# Patient Record
Sex: Male | Born: 1945 | Race: White | Hispanic: No | Marital: Married | Smoking: Never smoker
Health system: Southern US, Community
[De-identification: ages and names within clinical notes are randomized; demographics above are authoritative.]

## PROBLEM LIST (undated history)

## (undated) DIAGNOSIS — I10 Essential (primary) hypertension: Secondary | ICD-10-CM

## (undated) HISTORY — PX: BRAIN SURGERY: SHX531

---

## 2003-02-27 ENCOUNTER — Emergency Department (HOSPITAL_COMMUNITY): Admission: AD | Admit: 2003-02-27 | Discharge: 2003-02-27 | Payer: Self-pay | Admitting: Family Medicine

## 2003-09-05 ENCOUNTER — Encounter: Admission: RE | Admit: 2003-09-05 | Discharge: 2003-09-05 | Payer: Self-pay | Admitting: Internal Medicine

## 2005-03-15 IMAGING — CR DG CHEST 2V
3 series · 3 of 3 positions shown · non-contrast
Comparison: none

CLINICAL DATA: Short of breath, wheezing.
 CHEST X-RAY
 Two views of the chest show the lungs to be slightly hyperaerated.  The heart is within normal limits in size.  No bony abnormality is seen.
 IMPRESSION
 No active lung disease.  Slight hyperaeration.

[view not recorded (1 of 3)]
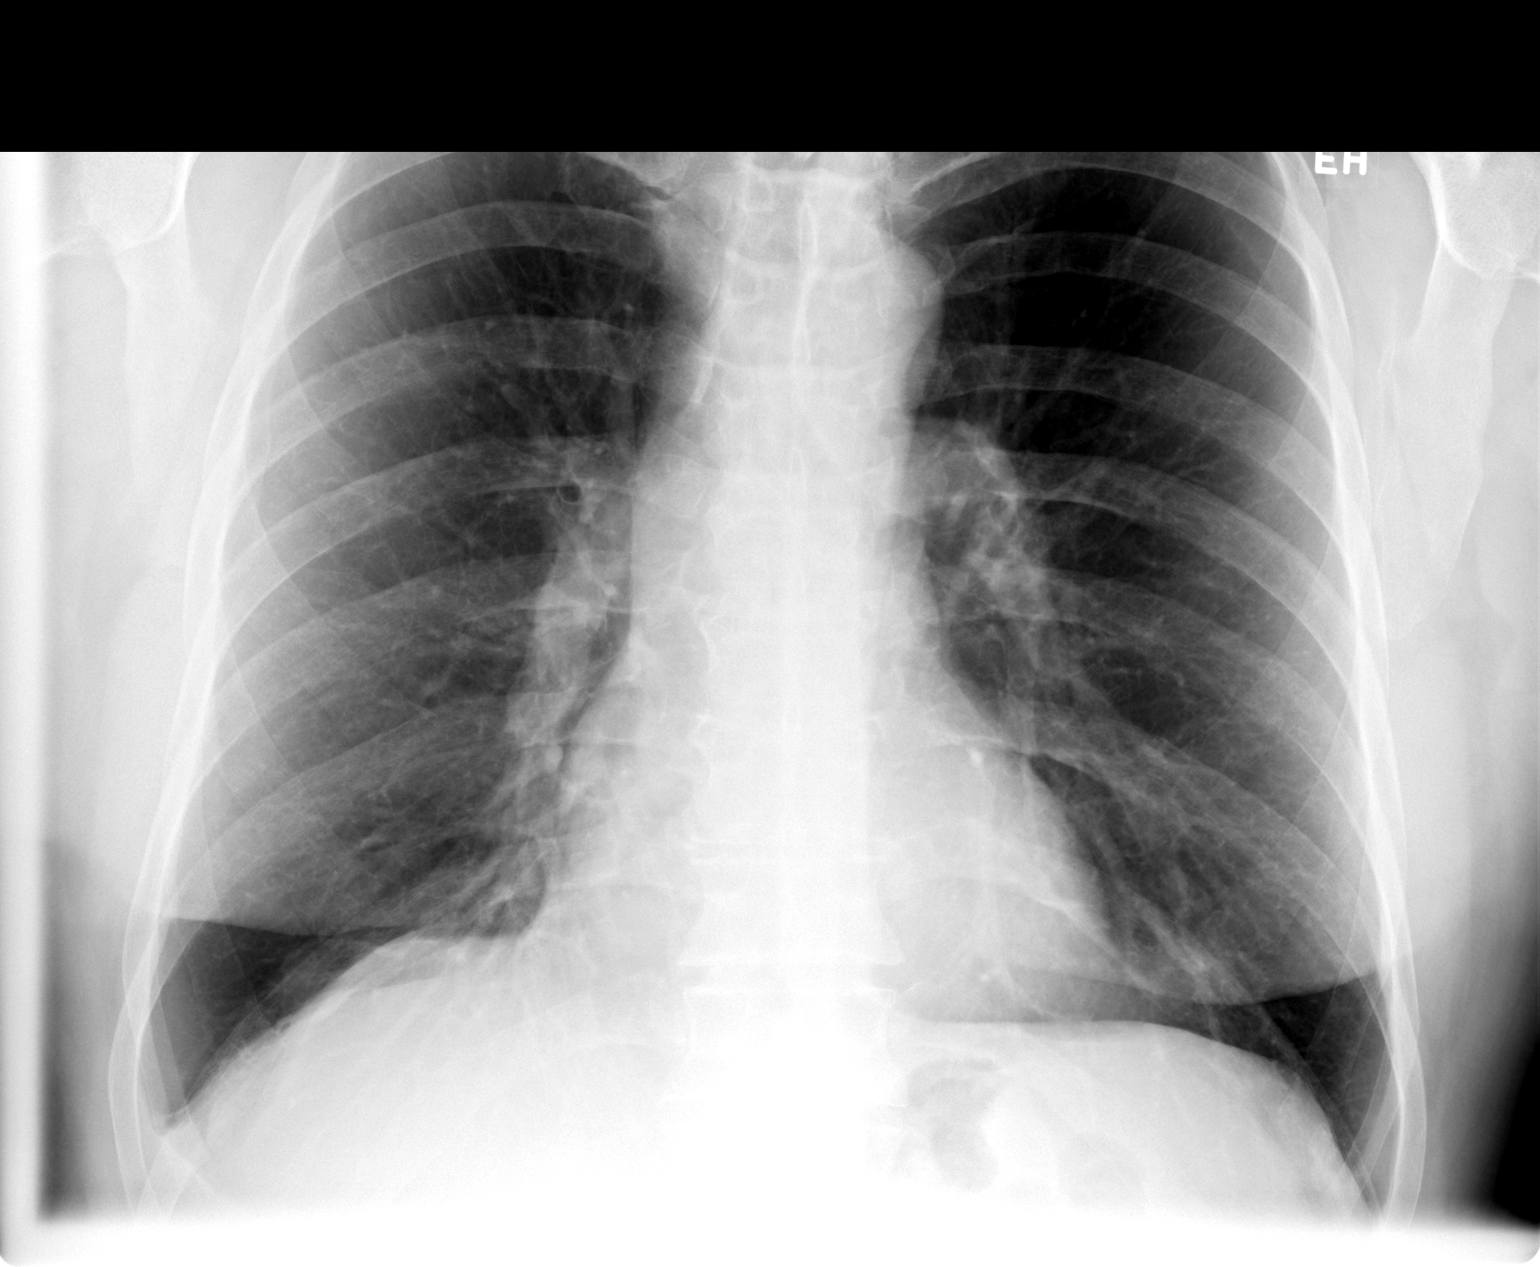

[view not recorded (2 of 3)]
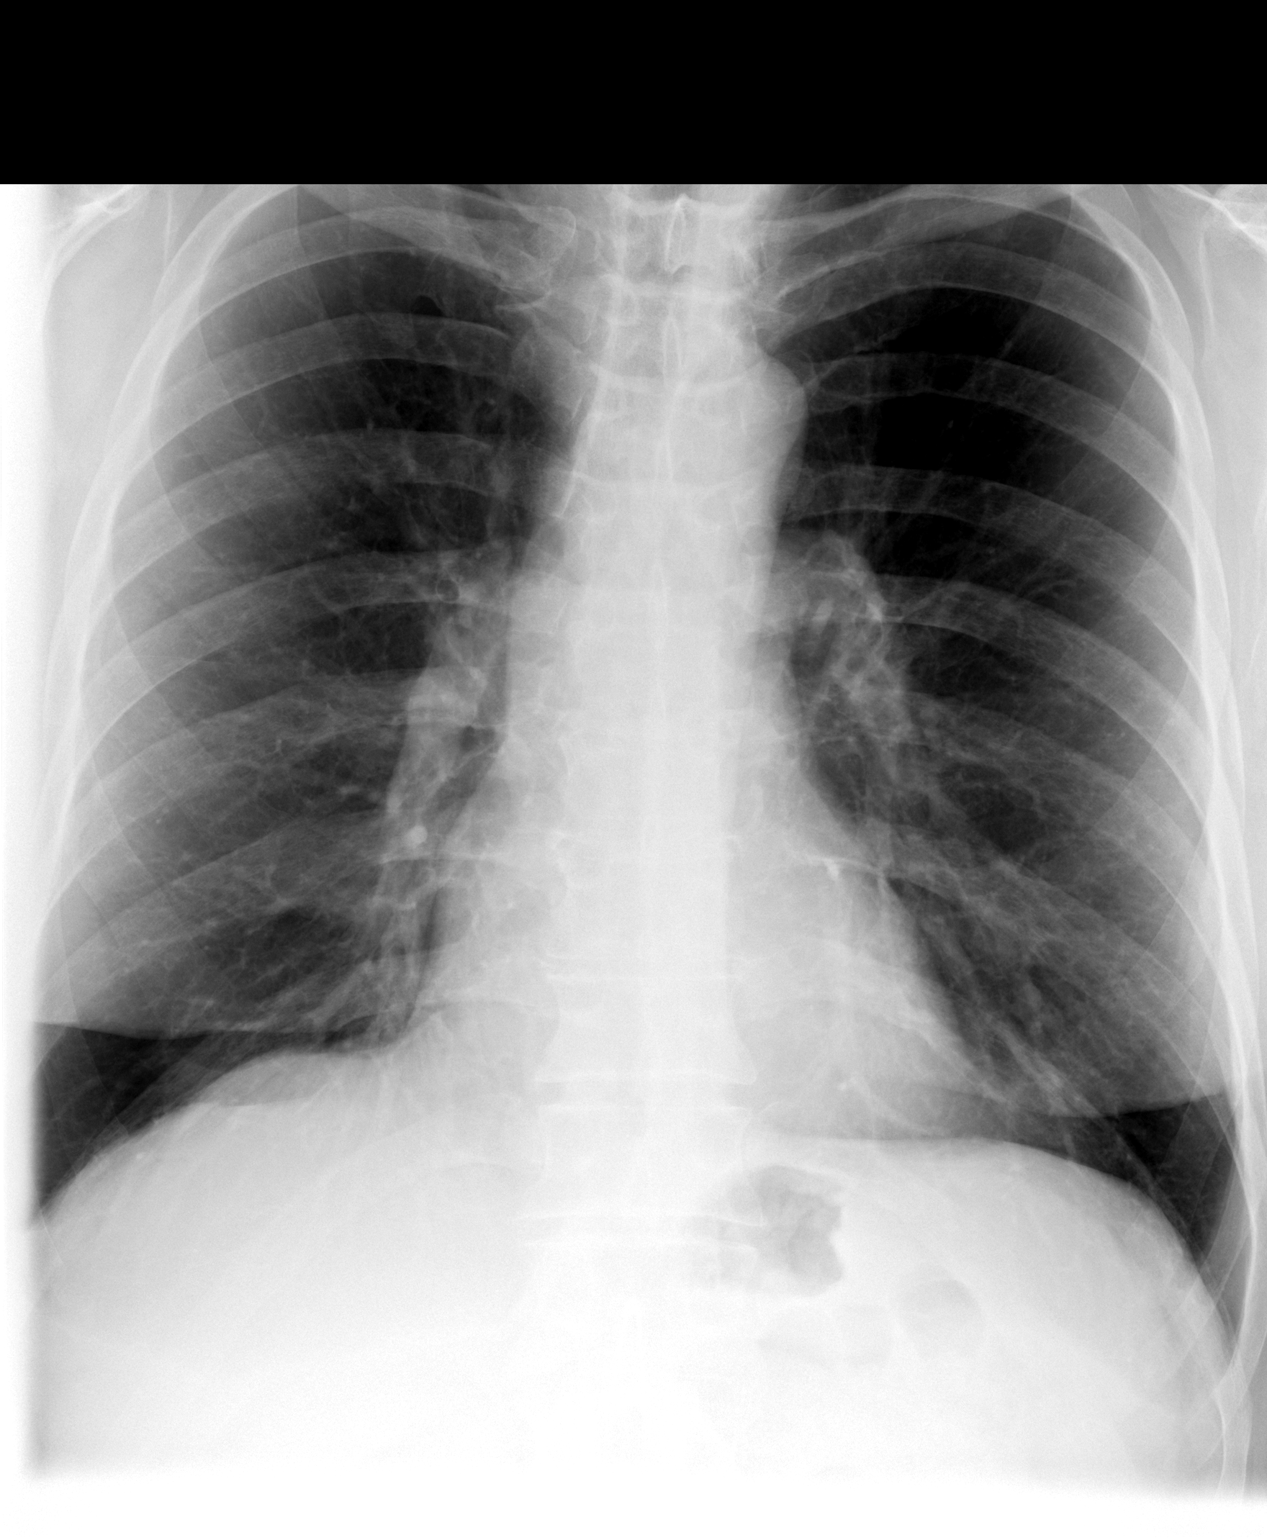

[view not recorded (3 of 3)]
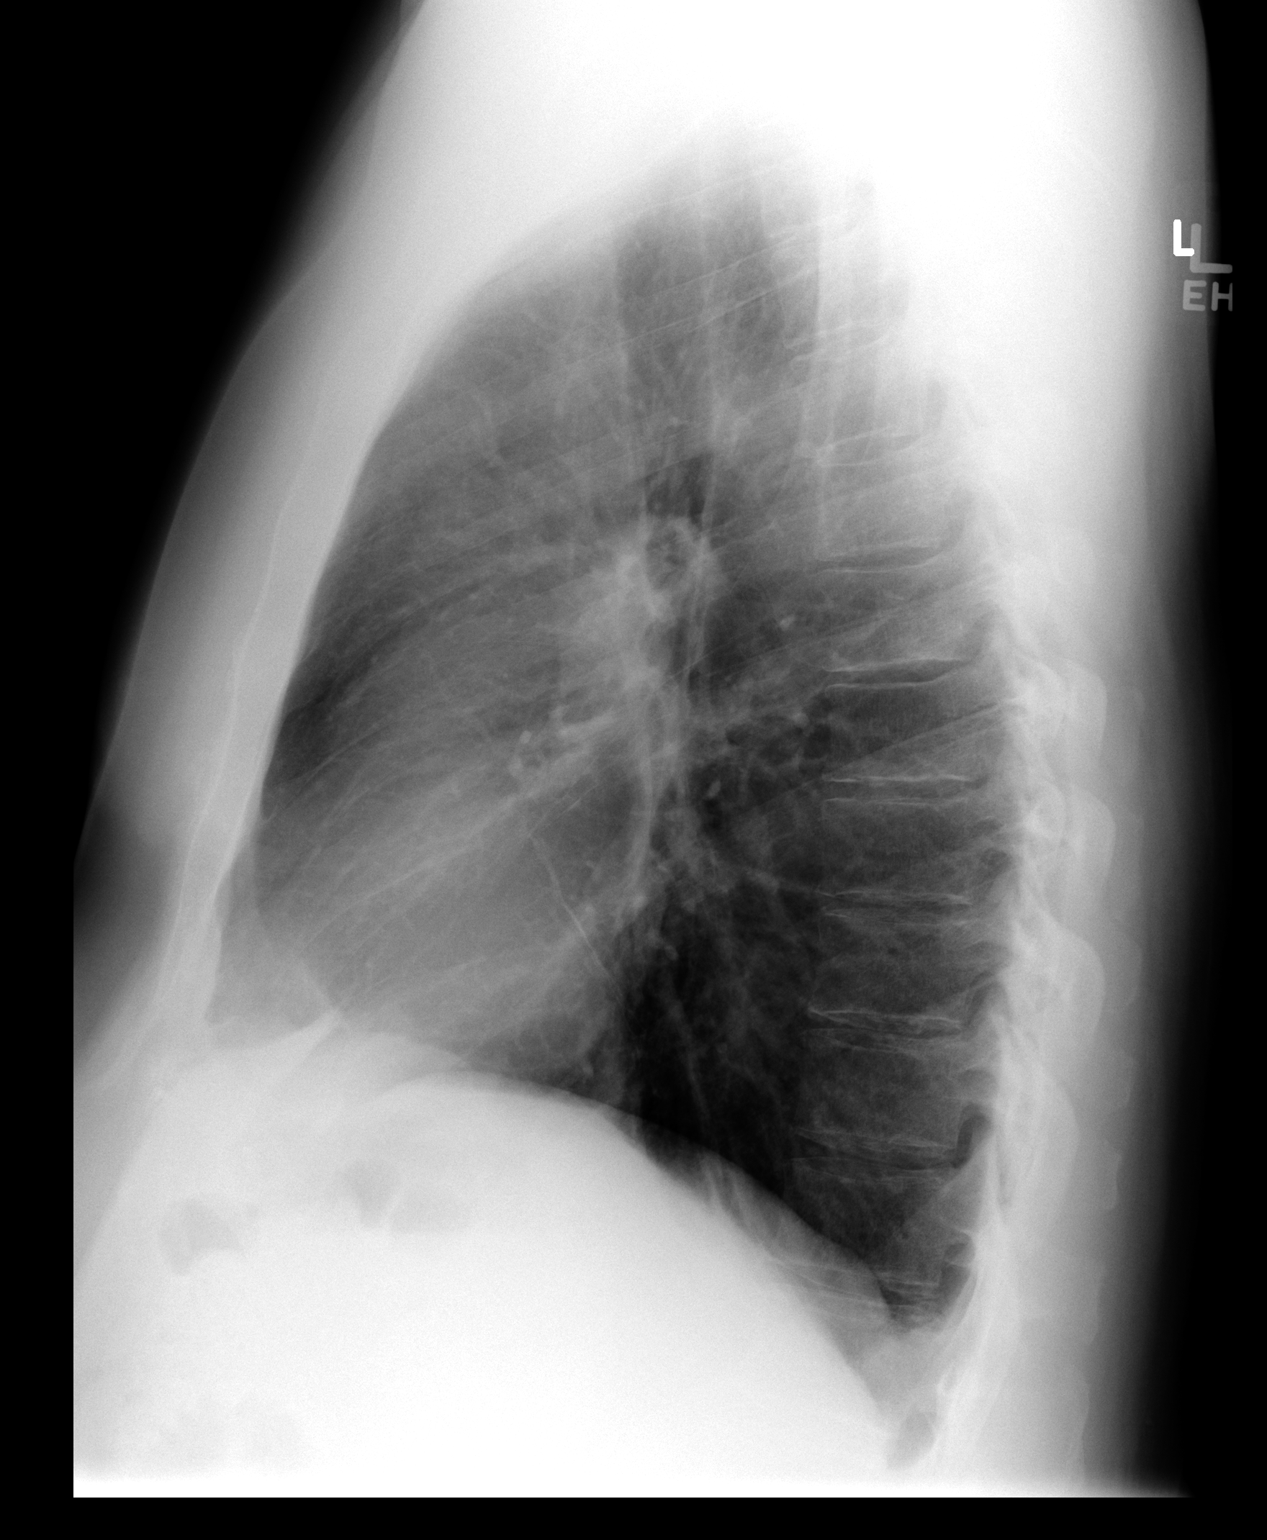

[3 of 3 positions shown; findings below may reference images not displayed]

## 2006-09-02 ENCOUNTER — Encounter (INDEPENDENT_AMBULATORY_CARE_PROVIDER_SITE_OTHER): Payer: Self-pay | Admitting: Gastroenterology

## 2006-09-02 ENCOUNTER — Ambulatory Visit (HOSPITAL_COMMUNITY): Admission: RE | Admit: 2006-09-02 | Discharge: 2006-09-02 | Payer: Self-pay | Admitting: Gastroenterology

## 2009-05-11 ENCOUNTER — Emergency Department (HOSPITAL_BASED_OUTPATIENT_CLINIC_OR_DEPARTMENT_OTHER): Admission: EM | Admit: 2009-05-11 | Discharge: 2009-05-11 | Payer: Self-pay | Admitting: Emergency Medicine

## 2019-03-10 ENCOUNTER — Other Ambulatory Visit: Payer: Self-pay | Admitting: Family Medicine

## 2019-03-10 DIAGNOSIS — R0989 Other specified symptoms and signs involving the circulatory and respiratory systems: Secondary | ICD-10-CM

## 2019-03-20 ENCOUNTER — Other Ambulatory Visit: Payer: Self-pay

## 2019-04-18 ENCOUNTER — Ambulatory Visit: Payer: Medicare Other | Attending: Internal Medicine

## 2019-04-18 DIAGNOSIS — Z23 Encounter for immunization: Secondary | ICD-10-CM

## 2019-04-18 NOTE — Progress Notes (Signed)
   Covid-19 Vaccination Clinic  Name:  Derek Proctor    MRN: [id number] DOB: 07-18-1945  04/18/2019  [name] was observed post Covid-19 immunization for 15 minutes without incidence. He was provided with Vaccine Information Sheet and instruction to access the V-Safe system.   [name] was instructed to call 911 with any severe reactions post vaccine: Marland Kitchen Difficulty breathing  . Swelling of your face and throat  . A fast heartbeat  . A bad rash all over your body  . Dizziness and weakness    Immunizations Administered    Name Date Dose VIS Date Route   Pfizer COVID-19 Vaccine 04/18/2019  5:19 PM 0.3 mL 03/10/2019 Intramuscular   Manufacturer: [employer], [employer]   Lot: [id number]   NDC: [address]-8

## 2019-04-25 ENCOUNTER — Ambulatory Visit (HOSPITAL_COMMUNITY)
Admission: RE | Admit: 2019-04-25 | Discharge: 2019-04-25 | Disposition: A | Discharge status: Home / Self Care | Payer: Medicare Other | Source: Ambulatory Visit | Attending: Family Medicine | Admitting: Family Medicine

## 2019-04-25 ENCOUNTER — Other Ambulatory Visit: Payer: Self-pay | Admitting: Family Medicine

## 2019-04-25 ENCOUNTER — Other Ambulatory Visit: Payer: Self-pay

## 2019-04-25 DIAGNOSIS — R0989 Other specified symptoms and signs involving the circulatory and respiratory systems: Secondary | ICD-10-CM | POA: Diagnosis not present

## 2019-05-08 ENCOUNTER — Ambulatory Visit: Payer: Medicare Other | Attending: Internal Medicine

## 2019-05-08 DIAGNOSIS — Z23 Encounter for immunization: Secondary | ICD-10-CM | POA: Insufficient documentation

## 2019-05-08 NOTE — Progress Notes (Signed)
   Covid-19 Vaccination Clinic  Name:  Derek Proctor    MRN: [id number] DOB: 11/05/1945  05/08/2019  Mr. Stlaurent was observed post Covid-19 immunization for 15 minutes without incidence. He was provided with Vaccine Information Sheet and instruction to access the V-Safe system.   Mr. Enderle was instructed to call 911 with any severe reactions post vaccine: Marland Kitchen Difficulty breathing  . Swelling of your face and throat  . A fast heartbeat  . A bad rash all over your body  . Dizziness and weakness    Immunizations Administered    Name Date Dose VIS Date Route   Pfizer COVID-19 Vaccine 05/08/2019  9:22 AM 0.3 mL 03/10/2019 Intramuscular   Manufacturer: [employer], [employer]   Lot: [id number]   NDC: [address]-0

## 2020-11-28 ENCOUNTER — Emergency Department (HOSPITAL_BASED_OUTPATIENT_CLINIC_OR_DEPARTMENT_OTHER): Payer: Medicare Other

## 2020-11-28 ENCOUNTER — Emergency Department (HOSPITAL_BASED_OUTPATIENT_CLINIC_OR_DEPARTMENT_OTHER)
Admission: EM | Admit: 2020-11-28 | Discharge: 2020-11-28 | Disposition: A | Discharge status: Home / Self Care | Payer: Medicare Other | Attending: Emergency Medicine | Admitting: Emergency Medicine

## 2020-11-28 ENCOUNTER — Other Ambulatory Visit: Payer: Self-pay

## 2020-11-28 ENCOUNTER — Other Ambulatory Visit (HOSPITAL_BASED_OUTPATIENT_CLINIC_OR_DEPARTMENT_OTHER): Payer: Self-pay

## 2020-11-28 ENCOUNTER — Encounter (HOSPITAL_BASED_OUTPATIENT_CLINIC_OR_DEPARTMENT_OTHER): Payer: Self-pay | Admitting: *Deleted

## 2020-11-28 DIAGNOSIS — Y9389 Activity, other specified: Secondary | ICD-10-CM | POA: Insufficient documentation

## 2020-11-28 DIAGNOSIS — X58XXXA Exposure to other specified factors, initial encounter: Secondary | ICD-10-CM | POA: Diagnosis not present

## 2020-11-28 DIAGNOSIS — Z79899 Other long term (current) drug therapy: Secondary | ICD-10-CM | POA: Diagnosis not present

## 2020-11-28 DIAGNOSIS — I10 Essential (primary) hypertension: Secondary | ICD-10-CM | POA: Diagnosis not present

## 2020-11-28 DIAGNOSIS — S79911A Unspecified injury of right hip, initial encounter: Secondary | ICD-10-CM | POA: Diagnosis present

## 2020-11-28 DIAGNOSIS — S76011A Strain of muscle, fascia and tendon of right hip, initial encounter: Secondary | ICD-10-CM | POA: Insufficient documentation

## 2020-11-28 HISTORY — DX: Essential (primary) hypertension: I10

## 2020-11-28 MED ORDER — METHOCARBAMOL 500 MG PO TABS
500.0000 mg | ORAL_TABLET | Freq: Two times a day (BID) | ORAL | 0 refills | Status: AC
  Filled 2020-11-28: qty 20, 10d supply, fill #0

## 2020-11-28 MED ORDER — DICLOFENAC SODIUM 1 % EX GEL
2.0000 g | Freq: Four times a day (QID) | CUTANEOUS | 0 refills | Status: AC
  Filled 2020-11-28: qty 100, 12d supply, fill #0

## 2020-11-28 MED ORDER — ACETAMINOPHEN 325 MG PO TABS
650.0000 mg | ORAL_TABLET | Freq: Once | ORAL | Status: AC
  Administered 2020-11-28: 650 mg via ORAL
  Filled 2020-11-28: qty 2

## 2020-11-28 MED ORDER — LIDOCAINE 5 % EX PTCH
1.0000 | MEDICATED_PATCH | CUTANEOUS | Status: DC
  Administered 2020-11-28: 1 via TRANSDERMAL
  Filled 2020-11-28: qty 1

## 2020-11-28 MED ORDER — OXYCODONE-ACETAMINOPHEN 5-325 MG PO TABS
1.0000 | ORAL_TABLET | Freq: Once | ORAL | Status: AC
  Administered 2020-11-28: 1 via ORAL
  Filled 2020-11-28: qty 1

## 2020-11-28 NOTE — Discharge Instructions (Addendum)
Take the medications as needed to help with your symptoms. Follow-up with the orthopedic specialist listed below as well as your primary care provider. Return to the ER if you start to experience worsening pain, numbness, weakness, urinary symptoms, injuries or falls

## 2020-11-28 NOTE — ED Triage Notes (Signed)
Right hip pain for a week after riding a Surveyor, mining.

## 2020-11-28 NOTE — ED Provider Notes (Signed)
[facility] EMERGENCY DEPARTMENT Provider Note   CSN: [id number] Arrival date & time: 11/28/20  1410     History Chief Complaint  Patient presents with   Hip Pain    Derek Proctor is a 75 y.o. male with a past medical history of hypertension presenting to the ED with a chief complaint of right hip pain.  States that 1 week ago he was using his riding lawnmower which he has done on a regular basis for years.  When he got up from this noticed a pain in his right posterior hip/buttock area that was worse with certain movements and ambulation.  He says specifically when he ambulates and rotates his right leg the pain gets worse.  It improves with rest.  He has begun using a cane at home as he states that this helps relieve some of the pressure with ambulation.  He has tried Aleve, heating pad which has helped his symptoms.  He called his PCP as he continued to have symptoms and was told to come to the ER for imaging.  He denies any direct injury or fall, prior fracture, dislocation or procedure in the area, fever, numbness, urinary symptoms, back pain, groin pain. He had similar symptoms occur on his left hip a few weeks ago which improved after his physical therapy.  HPI     Past Medical History:  Diagnosis Date   Hypertension     There are no problems to display for this patient.   Past Surgical History:  Procedure Laterality Date   BRAIN SURGERY         No family history on file.  Social History   Tobacco Use   Smoking status: Never   Smokeless tobacco: Never  Vaping Use   Vaping Use: Never used  Substance Use Topics   Alcohol use: Yes   Drug use: Never    Home Medications Prior to Admission medications   Medication Sig Start Date End Date Taking? Authorizing Provider  diclofenac Sodium (VOLTAREN) 1 % GEL Apply 2 g topically 4 (four) times daily. 11/28/20  Yes Catrell Morrone, PA-C  felodipine (PLENDIL) 10 MG 24 hr tablet Take one tablet daily for blood  pressure control 02/07/20  Yes [provider]  lisinopril (ZESTRIL) 10 MG tablet Take 1 tablet by mouth daily. 07/22/20  Yes [provider]  methocarbamol (ROBAXIN) 500 MG tablet Take 1 tablet (500 mg total) by mouth 2 (two) times daily. 11/28/20  Yes Abria Vannostrand, PA-C  sildenafil (VIAGRA) 25 MG tablet TAKE 1 TO 4 TABLETS BY MOUTH AN HOUR BEFORE SEX AS NEEDED FOR ERECTILE DYSFUNCTION 08/31/19   [provider]    Allergies    Amlodipine  Review of Systems   Review of Systems  Constitutional:  Negative for chills and fever.  Gastrointestinal:  Negative for nausea and vomiting.  Musculoskeletal:  Positive for arthralgias. Negative for myalgias.  Skin:  Negative for wound.  Neurological:  Negative for weakness and numbness.   Physical Exam Updated Vital Signs BP (!) 141/72 (BP Location: Right Arm)   Pulse 82   Temp 98.4 F (36.9 C) (Oral)   Resp 18   Ht 6' (1.829 m)   Wt 98.9 kg   SpO2 100%   BMI 29.57 kg/m   Physical Exam Vitals and nursing note reviewed.  Constitutional:      General: He is not in acute distress.    Appearance: He is well-developed. He is not diaphoretic.  HENT:  Head: Normocephalic and atraumatic.  Eyes:     General: No scleral icterus.    Conjunctiva/sclera: Conjunctivae normal.  Pulmonary:     Effort: Pulmonary effort is normal. No respiratory distress.  Musculoskeletal:     Cervical back: Normal range of motion.       Back:     Comments: No tenderness of the C, T or L-spine at the midline or paraspinal musculature.  Patient without any tenderness of the anterior or posterior hip.  However when he ambulates reports pain and tenderness of the indicated area.  He is ambulatory here.  No objective signs of numbness.  No saddle anesthesia.  Strength 5/5 in bilateral lower extremities.  2+ DP pulse palpated bilaterally.  Normal range of motion of bilateral hips, knees and ankles.  Skin:    Findings: No rash.  Neurological:      Mental Status: He is alert.    ED Results / Procedures / Treatments   Labs (all labs ordered are listed, but only abnormal results are displayed) Labs Reviewed - No data to display  EKG None  Radiology DG Hip Unilat W or Wo Pelvis 2-3 Views Right  Result Date: 11/28/2020 CLINICAL DATA:  Hip pain for 1 week, RIGHT SI joint pain for 1 week, no injury EXAM: DG HIP (WITH OR WITHOUT PELVIS) 2-3V RIGHT COMPARISON:  None FINDINGS: Osseous demineralization. Hip and SI joint spaces preserved. Two radiopaque foreign bodies project over the RIGHT hip region, variable position, question external artifacts. No fracture, dislocation, or bone destruction. Partial sacralization of the transverse processes of L5. Scattered pelvic phleboliths. IMPRESSION: No acute osseous abnormalities. Electronically Signed   By: Ulyses Southward M.D.   On: 11/28/2020 15:33    Procedures Procedures   Medications Ordered in ED Medications  oxyCODONE-acetaminophen (PERCOCET/ROXICET) 5-325 MG per tablet 1 tablet (has no administration in time range)  acetaminophen (TYLENOL) tablet 650 mg (has no administration in time range)  lidocaine (LIDODERM) 5 % 1 patch (has no administration in time range)    ED Course  I have reviewed the triage vital signs and the nursing notes.  Pertinent labs & imaging results that were available during my care of the patient were reviewed by me and considered in my medical decision making (see chart for details).    MDM Rules/Calculators/A&P                           75 year old male with a past medical history of hypertension presenting to the ED with a chief complaint of right hip pain.  Reports right posterior hip pain for the past week that is worse with ambulation.  Improves with rest.  It has improved with Aleve and heat but was sent to the ER from PCPs office for an x-ray.  He denies any direct injury, fracture, dislocation or procedure in the area.  On exam patient ambulatory here and  no tenderness at rest.  However when he ambulates he points to an area on his right buttock where he feels pain with rotation of his leg.  No concerning signs of numbness or saddle anesthesia.  Normal strength and sensation of bilateral lower extremities.  Equal and intact distal pulses of bilateral lower extremities.  Will obtain imaging and reassess.  X-rays negative for fracture or other acute abnormality.  Suspect that his symptoms are musculoskeletal in nature.  I doubt infectious or vascular cause based on his reassuring physical exam findings and work-up.  He remains ambulatory here.  Will treat with pain medication, muscle relaxer and Voltaren gel.  We will have him follow-up with orthopedist for additional evaluation.  Return precautions given.  All imaging, if done today, including plain films, CT scans, and ultrasounds, independently reviewed by me, and interpretations confirmed via formal radiology reads.  Patient is hemodynamically stable, in NAD, and able to ambulate in the ED. Evaluation does not show pathology that would require ongoing emergent intervention or inpatient treatment. I explained the diagnosis to the patient. Pain has been managed and has no complaints prior to discharge. Patient is comfortable with above plan and is stable for discharge at this time. All questions were answered prior to disposition. Strict return precautions for returning to the ED were discussed. Encouraged follow up with PCP.   An After Visit Summary was printed and given to the patient.   Portions of this note were generated with Scientist, clinical (histocompatibility and immunogenetics). Dictation errors may occur despite best attempts at proofreading.  Final Clinical Impression(s) / ED Diagnoses Final diagnoses:  Strain of right hip, initial encounter    Rx / DC Orders ED Discharge Orders          Ordered    diclofenac Sodium (VOLTAREN) 1 % GEL  4 times daily        11/28/20 1559    methocarbamol (ROBAXIN) 500 MG tablet  2  times daily        11/28/20 1559             Dietrich Pates, PA-C 11/28/20 1619    Milagros Loll, MD 11/29/20 1620

## 2021-10-02 ENCOUNTER — Other Ambulatory Visit: Payer: Self-pay | Admitting: Family Medicine

## 2021-10-02 DIAGNOSIS — Z87891 Personal history of nicotine dependence: Secondary | ICD-10-CM

## 2021-10-13 ENCOUNTER — Ambulatory Visit
Admission: RE | Admit: 2021-10-13 | Discharge: 2021-10-13 | Disposition: A | Discharge status: Home / Self Care | Payer: Medicare Other | Source: Ambulatory Visit | Attending: Family Medicine | Admitting: Family Medicine

## 2021-10-13 DIAGNOSIS — Z87891 Personal history of nicotine dependence: Secondary | ICD-10-CM

## 2023-05-04 DIAGNOSIS — R258 Other abnormal involuntary movements: Secondary | ICD-10-CM | POA: Diagnosis not present

## 2023-05-04 DIAGNOSIS — G510 Bell's palsy: Secondary | ICD-10-CM | POA: Diagnosis not present

## 2023-05-10 ENCOUNTER — Ambulatory Visit (HOSPITAL_COMMUNITY)
Admission: EM | Admit: 2023-05-10 | Discharge: 2023-05-10 | Discharge status: Against Medical Advice | Payer: Medicare Other

## 2023-05-10 DIAGNOSIS — G510 Bell's palsy: Secondary | ICD-10-CM | POA: Diagnosis not present

## 2023-05-10 DIAGNOSIS — R258 Other abnormal involuntary movements: Secondary | ICD-10-CM | POA: Diagnosis not present

## 2023-05-10 NOTE — Progress Notes (Signed)
   05/10/23 1755  [facility] Triage Screening (Walk-ins at [facility] only)  How Did You Hear About Us ? Family/Friend  What Is the Reason for Your Visit/Call Today? Derek Proctor is a 78 year old male presenting to [facility] accompanied by his daugther and son in law. Pt reports his family is concerned that he has a gambling addiction (from $30-$20,000 dollars of lost money). Pt reports that this addiction has started 40 years ago and got worse 7 years ago. Pt reports he has a hx of depression and anxiety. Pt states, "within the first year of my paralysis I felt increased anxiety and depression". Pt reports he is making impulsive finanical decisions and developing personal relationships with people online. Pt is looking for any type of resources to help with his ongoing addiction. Pt denies substance use, Si, Hi and Avh.  How Long Has This Been Causing You Problems? > than 6 months  Have You Recently Had Any Thoughts About Hurting Yourself? No  Are You Planning to Commit Suicide/Harm Yourself At This time? No  Have you Recently Had Thoughts About Hurting Someone Marigene Shoulder? No  Are You Planning To Harm Someone At This Time? No  Physical Abuse Denies  Verbal Abuse Denies  Sexual Abuse Denies  Exploitation of patient/patient's resources Denies  Self-Neglect Denies  Possible abuse reported to: Other (Comment)  Are you currently experiencing any auditory, visual or other hallucinations? No  Have You Used Any Alcohol or Drugs in the Past 24 Hours? No  Do you have any current medical co-morbidities that require immediate attention? No  Clinician description of patient physical appearance/behavior: calm, cooperative  What Do You Feel Would Help You the Most Today? Social Support;Stress Management  If access to [facility] was not available, would you have sought care in the Emergency Department? No  Determination of Need Routine (7 days)  Options For Referral Outpatient Therapy

## 2023-05-10 NOTE — Progress Notes (Signed)
   05/10/23 1755  [facility] Triage Screening (Walk-ins at [facility] only)  How Did You Hear About Us ? Family/Friend  What Is the Reason for Your Visit/Call Today? Derek Proctor is a 78 year old male presenting to [facility] accompanied by his daugther and son in law. Pt reports his family is concerned that he has a gambling addiction (from $30-$20,000 dollars of lost money). Pt reports that this addiction has started 40 years ago and got worse 7 years ago. Pt reports he has a hx of depression and anxiety. Pt states, "within the first year of my paralysis I felt increased anxiety and depression". Pt reports he is making impulsive finanical decisions and developing personal relationships with people online. Pt denies substance use, Si, Hi and Avh.  How Long Has This Been Causing You Problems? > than 6 months  Have You Recently Had Any Thoughts About Hurting Yourself? No  Are You Planning to Commit Suicide/Harm Yourself At This time? No  Have you Recently Had Thoughts About Hurting Someone Marigene Shoulder? No  Are You Planning To Harm Someone At This Time? No  Physical Abuse Denies  Verbal Abuse Denies  Sexual Abuse Denies  Exploitation of patient/patient's resources Denies  Self-Neglect Denies  Possible abuse reported to: Other (Comment)  Are you currently experiencing any auditory, visual or other hallucinations? No  Have You Used Any Alcohol or Drugs in the Past 24 Hours? No  Do you have any current medical co-morbidities that require immediate attention? No  Clinician description of patient physical appearance/behavior: calm, cooperative  What Do You Feel Would Help You the Most Today? Social Support;Stress Management  If access to [facility] was not available, would you have sought care in the Emergency Department? No  Determination of Need Routine (7 days)  Options For Referral Outpatient Therapy

## 2023-05-11 ENCOUNTER — Ambulatory Visit (HOSPITAL_COMMUNITY)
Admission: EM | Admit: 2023-05-11 | Discharge: 2023-05-11 | Disposition: A | Discharge status: Home / Self Care | Payer: Medicare Other | Attending: Psychiatry | Admitting: Psychiatry

## 2023-05-11 DIAGNOSIS — F630 Pathological gambling: Secondary | ICD-10-CM

## 2023-05-11 NOTE — Progress Notes (Signed)
   05/11/23 0733  [facility] Triage Screening (Walk-ins at [facility] only)  What Is the Reason for Your Visit/Call Today? Derek Proctor presents to [facility] voluntarily accompanied by his wife. Pt shares that he was here on yesterday but decided to leave. Pt shares that he call [facility] and attended a meeting last night. Pt reports his family is concerned that he has a investment problem and gambling addiction (from $30-$20,000 dollars of lost money). Pt reports that this addiction has started 40 years ago and got worse 7 years ago. Pt reports he has a hx of depression and anxiety. Pt states, "within the first year of my paralysis I felt increased anxiety and depression". Pt reports he is making impulsive finanical decisions and developing personal relationships with people online. Pt is looking for any type of resources to help with his ongoing addiction. Pt denies substance use SI, HI and AVH.  How Long Has This Been Causing You Problems? > than 6 months  Have You Recently Had Any Thoughts About Hurting Yourself? No  Are You Planning to Commit Suicide/Harm Yourself At This time? No  Have you Recently Had Thoughts About Hurting Someone Karolee Ohs? No  Are You Planning To Harm Someone At This Time? No  Physical Abuse Denies  Verbal Abuse Denies  Sexual Abuse Denies  Exploitation of patient/patient's resources Yes, past (Comment)  Self-Neglect Denies  Are you currently experiencing any auditory, visual or other hallucinations? No  Have You Used Any Alcohol or Drugs in the Past 24 Hours? No  Do you have any current medical co-morbidities that require immediate attention? No  Clinician description of patient physical appearance/behavior: well groomed, calm, cooperative  What Do You Feel Would Help You the Most Today? Social Support  If access to [facility] Urgent [facility] was not available, would you have sought care in the Emergency Department? No  Determination of Need Routine (7 days)  Options For Referral Outpatient Therapy

## 2023-05-11 NOTE — ED Provider Notes (Signed)
Behavioral Health Urgent Care Medical Screening Exam  Patient Name: Derek Proctor MRN: [id number] Date of Evaluation: 05/11/23 Chief Complaint:   Diagnosis:  Final diagnoses:  Gambling disorder, persistent    History of Present illness: Derek Proctor is a 78 y.o. male.   Patient presents to [facility] voluntarily, accompanied by his wife and son-in-law. He presents with chief complaint of "I have been engaging in behaviors that are affecting family finances...". Patient reports that he has been having  a problem of gambling along with online relationships with other women". 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 He started feeling bored and lonely and using social media a lot. His gambling intensified. He also started having long distance relationship with women and would send them money "to support them..because I was feeling emotionally supported, it made me feel better". He reports that his wife was always busy doing her own things. A few months ago, patient's family noticed that there is a serious financial problem due to patient's gambling and wasting money on women. Patient was confronted by his daughter who encouraged him to seek help for his gambling and distance relationships. Patient reports that "I wasn't convinced initially, but now I am open to exploring, talk to professionals, I know I have hurt my wife, I really need therapy". Patient attended a Gambling meeting yesterday "and I liked it, I was able to talk, but its not a professional session, it is just someone who had the same gambling problems and teaching [place] from his experience".   Patient reports that he was once on anxiety and depression medications and stopped taking them without consulting with the prescriber. He reports that the medications  were causing him erectile dysfunction. His gambling symptoms intensified after he stopped taking medications. He reports feeling guilty for these behaviors and states "my wife is depressed, I am guilty of that".  Patient reports that his family is supportive and they have taken all measures to control his money spending "and I am ok with that".   Assessment: Patient is evaluated face-to-face by this provider and chart/nursing notes reviewed. 78 year-old male sitting in the assessment room alone. He is appropriately dressed and groomed. He appears healthy and well nourished. He is pleasant and cooperative. His thought process is clear and goal-directed. He does not appear to be preoccupied or responding to internal stimuli. He denies hallucinations. Denies SI/HI. His has good eye contact and remains focused on the subject. He expresses guilt and shame and remorse and states "I know I hurt my wife of 52 years, I am the cause of her depression". Patient reports that his surgery  has a lot to do with his behaviors. He reports that "it was not easy for me staying home in the chair, lonely".   Patient reports no additional medical conditions. Denies headache/dizziness. He denies respiratory distress. Denies chest/back/abdominal pain. Denies muscle/joint pain. Denies nausea/vomiting. He reports that he sleeps well and his appetite is good.   Patient expresses motivation for therapy to address his addiction to women and gambling. He is willing to work with his family  to manage finances and to decrease use of social media.  Patient presents with no acute distress although he is concerned about his addiction. He is willing to program  in outpatient services for therapy first and would reconsider antidepressants/antianxiety medications later if therapy alone does not ease his symptoms. Resources provided and patient reported that he will present  there in AM. Dr Enedina Finner was consulted and agreed with this recommendation.    Flowsheet Row ED from 05/11/2023 in [facility] ED from 05/10/2023 in [facility] ED from 11/28/2020 in [facility] Emergency Department at [facility]  C-SSRS RISK CATEGORY No Risk No Risk No Risk       Psychiatric Specialty Exam  Presentation  General Appearance:Casual  Eye Contact:Good  Speech:Clear and Coherent  Speech Volume:Normal  Handedness:Right   Mood and Affect  Mood: Anxious  Affect: Appropriate   Thought Process  Thought Processes: Coherent  Descriptions of Associations:Intact  Orientation:Full (Time, Place and Person)  Thought Content:Logical    Hallucinations:None  Ideas of Reference:None  Suicidal Thoughts:No  Homicidal Thoughts:No   Sensorium  Memory: Immediate Good; Recent Good; Remote Good  Judgment: Fair  Insight: Fair   Chartered certified accountant: Fair  Attention Span: Fair  Recall: [employer] of Knowledge: Fair  Language: Fair   Psychomotor Activity  Psychomotor Activity: Normal   Assets  Assets: Manufacturing systems engineer; Desire for Improvement; Social Support; Health and safety inspector; Housing   Sleep  Sleep: Fair  Number of hours:  6   Physical Exam: Physical Exam Vitals and nursing note reviewed.  Constitutional:      Appearance: Normal appearance.  HENT:     Head: Normocephalic and atraumatic.     Right Ear: Tympanic membrane normal.     Left Ear: Tympanic membrane normal.     Nose: Nose normal.     Mouth/Throat:     Mouth: Mucous membranes are moist.  Eyes:     Extraocular Movements: Extraocular movements intact.     Pupils: Pupils are equal, round, and reactive to light.  Cardiovascular:     Rate and Rhythm: Normal rate.     Pulses: Normal pulses.  Pulmonary:     Effort: Pulmonary effort is normal.  Musculoskeletal:        General: Normal range of motion.     Cervical back: Normal range of motion and  neck supple.  Neurological:     General: No focal deficit present.     Mental Status: He is alert and oriented to person, place, and time.    Review of Systems  Constitutional: Negative.   HENT: Negative.    Eyes: Negative.   Respiratory: Negative.    Cardiovascular: Negative.   Gastrointestinal: Negative.   Genitourinary: Negative.   Musculoskeletal: Negative.   Skin: Negative.   Neurological: Negative.   Endo/Heme/Allergies: Negative.   Psychiatric/Behavioral:  Positive for depression. The patient is nervous/anxious.    Blood pressure (!) 170/93, pulse 85, resp. rate 18, SpO2 98%. There is no height or weight on file to calculate BMI.  Musculoskeletal: Strength & Muscle Tone: within normal limits Gait & Station: normal Patient leans: N/A   [facility] MSE Discharge Disposition for Follow up and Recommendations: Based on my evaluation the patient does not appear to have an emergency medical condition and can be discharged with resources and follow up care in outpatient services for Medication Management, Individual Therapy, and Group Therapy   Olin Pia, NP 05/11/2023, 1:24 PM

## 2023-05-11 NOTE — Discharge Instructions (Signed)

## 2023-05-28 DIAGNOSIS — R258 Other abnormal involuntary movements: Secondary | ICD-10-CM | POA: Diagnosis not present

## 2023-05-28 DIAGNOSIS — G510 Bell's palsy: Secondary | ICD-10-CM | POA: Diagnosis not present
